# Patient Record
Sex: Male | Born: 1960 | Race: Black or African American | Hispanic: No | Marital: Married | Smoking: Never smoker
Health system: Southern US, Community
[De-identification: ages and names within clinical notes are randomized; demographics above are authoritative.]

## PROBLEM LIST (undated history)

## (undated) DIAGNOSIS — I10 Essential (primary) hypertension: Secondary | ICD-10-CM

## (undated) DIAGNOSIS — M1990 Unspecified osteoarthritis, unspecified site: Secondary | ICD-10-CM

## (undated) DIAGNOSIS — G4733 Obstructive sleep apnea (adult) (pediatric): Secondary | ICD-10-CM

## (undated) HISTORY — PX: OTHER SURGICAL HISTORY: SHX169

---

## 2012-01-04 ENCOUNTER — Encounter (HOSPITAL_COMMUNITY): Payer: Self-pay | Admitting: Emergency Medicine

## 2012-01-04 ENCOUNTER — Emergency Department (HOSPITAL_COMMUNITY): Payer: No Typology Code available for payment source

## 2012-01-04 ENCOUNTER — Emergency Department (HOSPITAL_COMMUNITY)
Admission: EM | Admit: 2012-01-04 | Discharge: 2012-01-04 | Disposition: A | Discharge status: Home / Self Care | Payer: No Typology Code available for payment source | Attending: Emergency Medicine | Admitting: Emergency Medicine

## 2012-01-04 DIAGNOSIS — IMO0002 Reserved for concepts with insufficient information to code with codable children: Secondary | ICD-10-CM

## 2012-01-04 HISTORY — DX: Unspecified osteoarthritis, unspecified site: M19.90

## 2012-01-04 HISTORY — DX: Obstructive sleep apnea (adult) (pediatric): G47.33
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
## 2012-01-05 NOTE — ED Provider Notes (Signed)
Medical screening examination/treatment/procedure(s) were performed by non-physician practitioner and as supervising physician I was immediately available for consultation/collaboration.   Zerick Prevette, MD 01/05/12 1344 

## 2018-11-09 ENCOUNTER — Other Ambulatory Visit: Payer: Self-pay | Admitting: *Deleted

## 2018-11-09 DIAGNOSIS — Z20822 Contact with and (suspected) exposure to covid-19: Secondary | ICD-10-CM

## 2018-11-13 LAB — NOVEL CORONAVIRUS, NAA: SARS-CoV-2, NAA: NOT DETECTED

## 2020-05-17 ENCOUNTER — Other Ambulatory Visit: Payer: Self-pay

## 2020-05-17 ENCOUNTER — Encounter (HOSPITAL_BASED_OUTPATIENT_CLINIC_OR_DEPARTMENT_OTHER): Payer: Self-pay

## 2020-05-17 ENCOUNTER — Emergency Department (HOSPITAL_BASED_OUTPATIENT_CLINIC_OR_DEPARTMENT_OTHER)
Admission: EM | Admit: 2020-05-17 | Discharge: 2020-05-17 | Disposition: A | Discharge status: Home / Self Care | Payer: BLUE CROSS/BLUE SHIELD | Attending: Emergency Medicine | Admitting: Emergency Medicine

## 2020-05-17 ENCOUNTER — Other Ambulatory Visit (HOSPITAL_BASED_OUTPATIENT_CLINIC_OR_DEPARTMENT_OTHER): Payer: Self-pay | Admitting: Family Medicine

## 2020-05-17 DIAGNOSIS — I10 Essential (primary) hypertension: Secondary | ICD-10-CM | POA: Insufficient documentation

## 2020-05-17 DIAGNOSIS — N12 Tubulo-interstitial nephritis, not specified as acute or chronic: Secondary | ICD-10-CM

## 2020-05-17 DIAGNOSIS — N200 Calculus of kidney: Secondary | ICD-10-CM | POA: Diagnosis not present

## 2020-05-17 DIAGNOSIS — N10 Acute tubulo-interstitial nephritis: Secondary | ICD-10-CM | POA: Insufficient documentation

## 2020-05-17 DIAGNOSIS — R319 Hematuria, unspecified: Secondary | ICD-10-CM | POA: Diagnosis present

## 2020-05-17 DIAGNOSIS — R310 Gross hematuria: Secondary | ICD-10-CM

## 2020-05-17 HISTORY — DX: Essential (primary) hypertension: I10

## 2020-05-17 LAB — URINALYSIS, ROUTINE W REFLEX MICROSCOPIC

## 2020-05-17 LAB — URINALYSIS, MICROSCOPIC (REFLEX)
RBC / HPF: >50 RBC/hpf (ref 0–5)
WBC, UA: >50 WBC/hpf (ref 0–5)

## 2020-05-17 MED ORDER — LIDOCAINE HCL (PF) 1 % IJ SOLN
2.0000 mL | Freq: Once | INTRAMUSCULAR | Status: AC
  Administered 2020-05-17: 2 mL
  Filled 2020-05-17: qty 5

## 2020-05-17 MED ORDER — CEFTRIAXONE SODIUM 1 G IJ SOLR
1.0000 g | Freq: Once | INTRAMUSCULAR | Status: AC
  Administered 2020-05-17: 1 g via INTRAMUSCULAR
  Filled 2020-05-17: qty 10

## 2020-05-17 MED ORDER — ONDANSETRON 4 MG PO TBDP
4.0000 mg | ORAL_TABLET | Freq: Once | ORAL | Status: AC
  Administered 2020-05-17: 4 mg via ORAL
  Filled 2020-05-17: qty 1

## 2020-05-17 MED ORDER — HYDROCODONE-ACETAMINOPHEN 5-325 MG PO TABS: 1.0000 | ORAL_TABLET | ORAL | 0 refills | Status: DC | PRN

## 2020-05-17 MED ORDER — CEPHALEXIN 500 MG PO CAPS: 500.0000 mg | ORAL_CAPSULE | Freq: Two times a day (BID) | ORAL | 0 refills | Status: DC

## 2020-05-17 MED ORDER — ONDANSETRON HCL 4 MG PO TABS: 4.0000 mg | ORAL_TABLET | Freq: Three times a day (TID) | ORAL | 0 refills | Status: DC | PRN

## 2020-05-17 MED ORDER — HYDROCODONE-ACETAMINOPHEN 5-325 MG PO TABS
1.0000 | ORAL_TABLET | Freq: Once | ORAL | Status: AC
  Administered 2020-05-17: 1 via ORAL
  Filled 2020-05-17: qty 1

## 2020-05-17 MED FILL — CEPHALEXIN 500 MG CAPSULE: 500 | 6 days supply | Qty: 12 | Fill #0

## 2020-05-17 MED FILL — ONDANSETRON HCL 4 MG TABLET: 4 | 6 days supply | Qty: 20 | Fill #0

## 2020-05-17 MED FILL — HYDROCODON-APAP 5-325: 5-325 | 2 days supply | Qty: 10 | Fill #0

## 2020-05-17 NOTE — Discharge Instructions (Signed)
You were evaluated for hematuria (blood in urine).  Your urine studies were consistent with a kidney infection called pyelonephritis. We will treat you with an injection of antibiotics (Rocephin) and have you continue antibiotics orally for 6 days (Keflex).   We were unable to appreciate any large stones actively passing through your ureters based on results of this imaging study.   We were able to discuss your case with the physicians at the urology office that you were referred to and they have agreed to move your appointment up to Tuesday rather than Thursday.  Your appointment will be with Dr. Patsi Sears at  11:15 AM on 05/21/2020 at  [address], [place], [place]  Please try to drink plenty of fluids to help stay hydrated and passing of any small stones that may be present.   Please return to care if you experience worsening pain despite pain medications, persistent vomiting, fevers or are unable to tolerate oral fluids.

## 2020-05-17 NOTE — ED Triage Notes (Signed)
Pt reports hx of kidney stone in nov that he has been taking flomax regularly. Pt reports blood in urine, painful urination and L flank pain. Pt states he left [facility] due to long wait, but prior to arrival had a CT and blood work drawn, and a UA. Pt states when he was seen and dx for a kidney stone he had pain on the R side.

## 2020-05-17 NOTE — ED Provider Notes (Signed)
[facility] EMERGENCY DEPARTMENT Provider Note   CSN: [id number] Arrival date & time: 05/17/20  4081     History Chief Complaint  Patient presents with  . Hematuria    Stephen Cox is a 60 y.o. male.    Patient reports he has been having hematuria for >24 hours along with left sided flank pain and suprapubic abdominal pain.  Patient also reports experiencing urinary urgency and frequency as well as the sensation that he is unable to completely empty his bladder.  Patient reports that flank pain is similar to when he was diagnosed with a right renal stone in September 2021.  Patient reports having urology follow-up scheduled for 05/23/2020.  He states that he was in the ED in [facility] overnight where he had imaging and urine studies completed.  Results of these studies listed in detail as below.  Patient denies any fevers or chills.  He does report 1 episode of emesis on the way to [facility] this morning.  Patient is unaware of any family history of renal cell carcinoma or frequent nephrolithiasis as he is adopted.  Patient does report history of having scrotal cyst removed.  Patient denies any prior history of hematuria with his prior renal stone.  He reports passing small blood substances along with hematuria.  He denies any difficulty breathing or shortness of breath or chest pain.    Chart Review  Patient was evaluated in [facility] ED on 05/16/20 where stone study was completed and showed left nephrolithiasis that was nonobstructive. Also noted right inguinal hernia and chronic hydroureteronephrosis.         Past Medical History:  Diagnosis Date  . DJD (degenerative joint disease)   . Hypertension   . Mild obstructive sleep apnea     There are no problems to display for this patient.   Past Surgical History:  Procedure Laterality Date  . scrotal cyst removal         History reviewed. No pertinent family history.  Social History   Tobacco Use   . Smoking status: Never Smoker  Substance Use Topics  . Alcohol use: No  . Drug use: No    Home Medications Prior to Admission medications   Medication Sig Start Date End Date Taking? Authorizing Provider  cephALEXin (KEFLEX) 500 MG capsule Take 1 capsule (500 mg total) by mouth every 12 (twelve) hours for 6 days. 05/18/20 05/24/20 Yes Simmons-Robinson, Bellah Alia, MD  HYDROcodone-acetaminophen (NORCO/VICODIN) 5-325 MG tablet Take 1 tablet by mouth every 4 (four) hours as needed for up to 3 days for moderate pain or severe pain. 05/17/20 05/20/20 Yes Simmons-Robinson, Francetta Ilg, MD  ondansetron (ZOFRAN) 4 MG tablet Take 1 tablet (4 mg total) by mouth every 8 (eight) hours as needed for nausea or vomiting. 05/17/20 05/17/21 Yes Simmons-Robinson, Inda Mcglothen, MD  amitriptyline (ELAVIL) 10 MG tablet Take 20 mg by mouth at bedtime as needed. migraine    [provider]  eletriptan (RELPAX) 20 MG tablet One tablet by mouth at onset of headache. May repeat in 2 hours if headache persists or recurs. Nausea. May repeat in 2 hours if necessary    [provider]  Multiple Vitamin (MULTIVITAMIN WITH MINERALS) TABS Take 1 tablet by mouth daily.    [provider]  promethazine (PHENERGAN) 25 MG tablet Take 25 mg by mouth every 6 (six) hours as needed. Nausea    [provider]    Allergies    Patient has no known allergies.  Review of Systems   Review of Systems  Constitutional: Positive for appetite change. Negative for chills and fever.  Gastrointestinal: Positive for abdominal pain, nausea and vomiting.  Genitourinary: Positive for flank pain, frequency, hematuria and urgency.  Musculoskeletal:       Flank pain    Neurological: Positive for headaches.    Physical Exam Updated Vital Signs BP (!) 161/106 (BP Location: Right Arm)   Pulse 93   Temp 98 F (36.7 C) (Oral)   Resp 18   Ht 5\' 8"  (1.727 m)   Wt 93.4 kg   SpO2 99%   BMI 31.32 kg/m   Physical  Exam Vitals reviewed.  Constitutional:      Appearance: Normal appearance.     Comments: Patient lying still in bed, uncomfortable appearing   Cardiovascular:     Rate and Rhythm: Normal rate and regular rhythm.     Pulses: Normal pulses.     Heart sounds: Normal heart sounds.  Pulmonary:     Effort: Pulmonary effort is normal. No respiratory distress.     Breath sounds: Normal breath sounds. No wheezing.  Abdominal:     General: Bowel sounds are decreased.     Palpations: Abdomen is soft.     Tenderness: There is abdominal tenderness in the suprapubic area, left upper quadrant and left lower quadrant. There is left CVA tenderness. There is no right CVA tenderness.  Skin:    General: Skin is warm and dry.     Capillary Refill: Capillary refill takes less than 2 seconds.     Findings: No erythema or rash.  Neurological:     Mental Status: He is alert and oriented to person, place, and time.     ED Results / Procedures / Treatments   Labs (all labs ordered are listed, but only abnormal results are displayed) Labs Reviewed  URINALYSIS, ROUTINE W REFLEX MICROSCOPIC - Abnormal; Notable for the following components:      Result Value   Color, Urine RED (*)    APPearance TURBID (*)    Glucose, UA   (*)    Value: TEST NOT REPORTED DUE TO COLOR INTERFERENCE OF URINE PIGMENT   Hgb urine dipstick   (*)    Value: TEST NOT REPORTED DUE TO COLOR INTERFERENCE OF URINE PIGMENT   Bilirubin Urine   (*)    Value: TEST NOT REPORTED DUE TO COLOR INTERFERENCE OF URINE PIGMENT   Ketones, ur   (*)    Value: TEST NOT REPORTED DUE TO COLOR INTERFERENCE OF URINE PIGMENT   Protein, ur   (*)    Value: TEST NOT REPORTED DUE TO COLOR INTERFERENCE OF URINE PIGMENT   Nitrite   (*)    Value: TEST NOT REPORTED DUE TO COLOR INTERFERENCE OF URINE PIGMENT   Leukocytes,Ua   (*)    Value: TEST NOT REPORTED DUE TO COLOR INTERFERENCE OF URINE PIGMENT   All other components within normal limits  URINALYSIS,  MICROSCOPIC (REFLEX) - Abnormal; Notable for the following components:   Bacteria, UA MANY (*)    All other components within normal limits  URINE CULTURE    EKG None  Radiology No results found.  Procedures Procedures (including critical care time)  Medications Ordered in ED Medications  lidocaine (PF) (XYLOCAINE) 1 % injection 2 mL (has no administration in time range)  HYDROcodone-acetaminophen (NORCO/VICODIN) 5-325 MG per tablet 1 tablet (1 tablet Oral Given 05/17/20 0925)  ondansetron (ZOFRAN-ODT) disintegrating tablet 4 mg (4 mg Oral Given  05/17/20 0925)  cefTRIAXone (ROCEPHIN) injection 1 g (1 g Intramuscular Given 05/17/20 1018)    ED Course  I have reviewed the triage vital signs and the nursing notes.  Pertinent labs & imaging results that were available during my care of the patient were reviewed by me and considered in my medical decision making (see chart for details).    MDM Rules/Calculators/A&P                          Stephen Cox is a 60 y.o. male presenting with hematuria, urinary urgency and frequency for over 24 hours.  Patient had prior CT stone study that showed punctate nephrolithiasis and hydronephrosis.  Patient's urine analysis from prior visit was consistent with hematuria, proteinuria and leukoesterase without presence of nitrites.  Patient was treated for pain management in the ED and able to tolerate fluids.  He reported improved pain and was arranged for urology follow-up on 05/21/2020 at 11:15 AM. Patient's U/A was difficult to result due to pigmentation, however did not many bacteria on microscopy, in setting of symptoms and CT imaging, this was consistent with acute pyelonephritis. Patient was treated with IM Ceftriaxone followed by 6 days of PO Keflex. Return precautions were discussed and patient were determined to be appropriate for discharge with outpatient urology follow up. At time of discharge, he remained afebrile.   Final Clinical Impression(s)  / ED Diagnoses Final diagnoses:  Gross hematuria  Nephrolithiasis  Pyelonephritis    Rx / DC Orders ED Discharge Orders         Ordered    cephALEXin (KEFLEX) 500 MG capsule  Every 12 hours        05/17/20 1013    HYDROcodone-acetaminophen (NORCO/VICODIN) 5-325 MG tablet  Every 4 hours PRN        05/17/20 0948    ondansetron (ZOFRAN) 4 MG tablet  Every 8 hours PRN        05/17/20 0948           Ronnald Ramp, MD 05/17/20 1026    Blane Ohara, MD 05/17/20 [phone]

## 2020-05-18 LAB — URINE CULTURE

## 2021-01-17 ENCOUNTER — Other Ambulatory Visit: Payer: Self-pay

## 2021-01-17 ENCOUNTER — Encounter (HOSPITAL_BASED_OUTPATIENT_CLINIC_OR_DEPARTMENT_OTHER): Payer: Self-pay | Admitting: Emergency Medicine

## 2021-01-17 ENCOUNTER — Emergency Department (HOSPITAL_BASED_OUTPATIENT_CLINIC_OR_DEPARTMENT_OTHER): Payer: BLUE CROSS/BLUE SHIELD

## 2021-01-17 ENCOUNTER — Emergency Department (HOSPITAL_BASED_OUTPATIENT_CLINIC_OR_DEPARTMENT_OTHER)
Admission: EM | Admit: 2021-01-17 | Discharge: 2021-01-17 | Disposition: A | Discharge status: Home / Self Care | Payer: BLUE CROSS/BLUE SHIELD | Attending: Emergency Medicine | Admitting: Emergency Medicine

## 2021-01-17 DIAGNOSIS — R519 Headache, unspecified: Secondary | ICD-10-CM | POA: Insufficient documentation

## 2021-01-17 DIAGNOSIS — R791 Abnormal coagulation profile: Secondary | ICD-10-CM | POA: Diagnosis not present

## 2021-01-17 DIAGNOSIS — Z20822 Contact with and (suspected) exposure to covid-19: Secondary | ICD-10-CM | POA: Diagnosis not present

## 2021-01-17 DIAGNOSIS — Z79899 Other long term (current) drug therapy: Secondary | ICD-10-CM | POA: Diagnosis not present

## 2021-01-17 DIAGNOSIS — I10 Essential (primary) hypertension: Secondary | ICD-10-CM | POA: Insufficient documentation

## 2021-01-17 DIAGNOSIS — N3000 Acute cystitis without hematuria: Secondary | ICD-10-CM | POA: Insufficient documentation

## 2021-01-17 DIAGNOSIS — R531 Weakness: Secondary | ICD-10-CM | POA: Insufficient documentation

## 2021-01-17 DIAGNOSIS — R42 Dizziness and giddiness: Secondary | ICD-10-CM | POA: Insufficient documentation

## 2021-01-17 LAB — COMPREHENSIVE METABOLIC PANEL
ALT: 20 U/L (ref 0–44)
AST: 21 U/L (ref 15–41)
Albumin: 3.3 g/dL — ABNORMAL LOW (ref 3.5–5.0)
Alkaline Phosphatase: 47 U/L (ref 38–126)
Anion gap: 8 (ref 5–15)
BUN: 14 mg/dL (ref 6–20)
CO2: 25 mmol/L (ref 22–32)
Calcium: 8.3 mg/dL — ABNORMAL LOW (ref 8.9–10.3)
Chloride: 101 mmol/L (ref 98–111)
Creatinine, Ser: 1.15 mg/dL (ref 0.61–1.24)
GFR, Estimated: >60 mL/min (ref >60)
Glucose, Bld: 104 mg/dL — ABNORMAL HIGH (ref 70–99)
Potassium: 3.2 mmol/L — ABNORMAL LOW (ref 3.5–5.1)
Sodium: 134 mmol/L — ABNORMAL LOW (ref 135–145)
Total Bilirubin: 0.6 mg/dL (ref 0.3–1.2)
Total Protein: 7.8 g/dL (ref 6.5–8.1)

## 2021-01-17 LAB — PROTIME-INR
INR: 1.3 — ABNORMAL HIGH (ref 0.8–1.2)
Prothrombin Time: 16.2 seconds — ABNORMAL HIGH (ref 11.4–15.2)

## 2021-01-17 LAB — CBC WITH DIFFERENTIAL/PLATELET
Abs Immature Granulocytes: 0.08 10*3/uL — ABNORMAL HIGH (ref 0.00–0.07)
Basophils Absolute: 0 10*3/uL (ref 0.0–0.1)
Basophils Relative: 0 %
Eosinophils Absolute: 0.1 10*3/uL (ref 0.0–0.5)
Eosinophils Relative: 2 %
HCT: 34.9 % — ABNORMAL LOW (ref 39.0–52.0)
Hemoglobin: 12 g/dL — ABNORMAL LOW (ref 13.0–17.0)
Immature Granulocytes: 1 %
Lymphocytes Relative: 17 %
Lymphs Abs: 1 10*3/uL (ref 0.7–4.0)
MCH: 29.9 pg (ref 26.0–34.0)
MCHC: 34.4 g/dL (ref 30.0–36.0)
MCV: 86.8 fL (ref 80.0–100.0)
Monocytes Absolute: 0.7 10*3/uL (ref 0.1–1.0)
Monocytes Relative: 12 %
Neutro Abs: 4.1 10*3/uL (ref 1.7–7.7)
Neutrophils Relative %: 68 %
Platelets: 187 10*3/uL (ref 150–400)
RBC: 4.02 MIL/uL — ABNORMAL LOW (ref 4.22–5.81)
RDW: 13 % (ref 11.5–15.5)
WBC: 6 10*3/uL (ref 4.0–10.5)
nRBC: 0 % (ref 0.0–0.2)

## 2021-01-17 LAB — URINALYSIS, MICROSCOPIC (REFLEX): WBC, UA: >50 WBC/hpf (ref 0–5)

## 2021-01-17 LAB — URINALYSIS, ROUTINE W REFLEX MICROSCOPIC
Glucose, UA: NEGATIVE mg/dL
Ketones, ur: NEGATIVE mg/dL
Nitrite: POSITIVE — AB
Protein, ur: 100 mg/dL — AB
Specific Gravity, Urine: 1.03 (ref 1.005–1.030)
pH: 6 (ref 5.0–8.0)

## 2021-01-17 LAB — MAGNESIUM: Magnesium: 1.8 mg/dL (ref 1.7–2.4)

## 2021-01-17 LAB — PHOSPHORUS: Phosphorus: 2.3 mg/dL — ABNORMAL LOW (ref 2.5–4.6)

## 2021-01-17 LAB — TROPONIN I (HIGH SENSITIVITY)
Troponin I (High Sensitivity): 5 ng/L (ref <18)
Troponin I (High Sensitivity): 6 ng/L (ref <18)

## 2021-01-17 LAB — RAPID URINE DRUG SCREEN, HOSP PERFORMED
Amphetamines: NOT DETECTED
Barbiturates: NOT DETECTED
Benzodiazepines: NOT DETECTED
Cocaine: NOT DETECTED
Opiates: NOT DETECTED
Tetrahydrocannabinol: NOT DETECTED

## 2021-01-17 LAB — RESP PANEL BY RT-PCR (FLU A&B, COVID) ARPGX2
Influenza A by PCR: NEGATIVE
Influenza B by PCR: NEGATIVE
SARS Coronavirus 2 by RT PCR: NEGATIVE

## 2021-01-17 LAB — TSH: TSH: 1.877 u[IU]/mL (ref 0.350–4.500)

## 2021-01-17 LAB — BRAIN NATRIURETIC PEPTIDE: B Natriuretic Peptide: 19 pg/mL (ref 0.0–100.0)

## 2021-01-17 MED ORDER — LACTATED RINGERS IV BOLUS
1000.0000 mL | Freq: Once | INTRAVENOUS | Status: AC
  Administered 2021-01-17: 1000 mL via INTRAVENOUS

## 2021-01-17 MED ORDER — SODIUM CHLORIDE 0.9 % IV SOLN
1.0000 g | Freq: Once | INTRAVENOUS | Status: AC
  Administered 2021-01-17: 1 g via INTRAVENOUS
  Filled 2021-01-17: qty 10

## 2021-01-17 MED ORDER — CEPHALEXIN 500 MG PO CAPS: 1000.0000 mg | ORAL_CAPSULE | Freq: Two times a day (BID) | ORAL | 0 refills | Status: DC

## 2021-01-17 MED ORDER — ACETAMINOPHEN 500 MG PO TABS
1000.0000 mg | ORAL_TABLET | Freq: Once | ORAL | Status: AC
  Administered 2021-01-17: 1000 mg via ORAL
  Filled 2021-01-17: qty 2

## 2021-01-17 NOTE — ED Triage Notes (Addendum)
Weak , dizzy and tingling in both hands  x 1 week saw [facility] yesterday and encouraged to come to ER states H/A also for 1 week

## 2021-01-17 NOTE — Discharge Instructions (Addendum)
1.  You have tested positive for urinary tract infection today.  You need antibiotics.  Take Keflex twice daily as prescribed.  Take extra strength Tylenol every 6 hours for aches and pains. 2.  Stay hydrated and rest. 3.  Schedule recheck with your doctor within the next 2 to 4 days. 4.  Return to the emergency department if you get any worsening symptoms.  Return for fever, pain, worsening weakness or confusion.

## 2021-01-17 NOTE — ED Provider Notes (Signed)
[facility] EMERGENCY DEPARTMENT Provider Note   CSN: [id number] Arrival date & time: 01/17/21  4970     History Chief Complaint  Patient presents with   Dizziness    Stephen Cox is a 60 y.o. male.  HPI Patient reports he is having some significant generalized weakness for about 5 to 6 days.  Patient reports he does work very hard with long hours.  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  He reports on Sunday he is busy with church events, [employer] and doing support activities.  Patient reports on Saturday he was trying to get some pretty extreme fatigue and general weakness.  He did not go to work for his night shift on Saturday.  He tried to go back for a couple more days and found that he was so fatigued that with standing he felt kind of dizzy and lightheaded.  He was not able to perform so well at his job because he could not do extra duties.  Patient denies he is having any specific pain.  He does note over the past couple days now he has had headache that somewhat at the back of his head and his temples.  Has been waxing and waning.  No cough, no chest pain, no shortness of breath.  No vomiting or diarrhea.  No abdominal pain no urinary changes.  Patient reports he has a history of hypertension and took antihypertensive medications for a while but then blood pressures improved and he has not been on any for a number of months.  He does note that sometimes his blood pressures are more elevated as he is monitoring them.  He might get systolic numbers up in the 150s maybe 170s and diastolic sometimes up to the 100s.  Patient does not smoke, drink alcohol or any drug use.    Past Medical History:  Diagnosis Date   DJD (degenerative joint disease)    Hypertension    Mild obstructive sleep apnea     There are no problems to display for this  patient.   Past Surgical History:  Procedure Laterality Date   scrotal cyst removal         No family history on file.  Social History   Tobacco Use   Smoking status: Never    Passive exposure: Never  Vaping Use   Vaping Use: Never used  Substance Use Topics   Alcohol use: No   Drug use: No    Home Medications Prior to Admission medications   Medication Sig Start Date End Date Taking? Authorizing Provider  cephALEXin (KEFLEX) 500 MG capsule Take 2 capsules (1,000 mg total) by mouth 2 (two) times daily. 01/17/21  Yes Donivan Thammavong, Lebron Conners, MD  amitriptyline (ELAVIL) 10 MG tablet Take 20 mg by mouth at bedtime as needed. migraine    [provider]  cephALEXin (KEFLEX) 500 MG capsule TAKE 1 CAPSULE BY MOUTH EVERY 12 HOURS FOR 6 DAYS 05/17/20 05/17/21  Simmons-Robinson, Tawanna Cooler, MD  eletriptan (RELPAX) 20 MG tablet One tablet by mouth at onset of headache. May repeat in 2 hours if headache persists or recurs. Nausea. May repeat in 2 hours if necessary    [provider]  Multiple Vitamin (MULTIVITAMIN WITH MINERALS) TABS Take 1 tablet by mouth daily.    [provider]  ondansetron [facility])  4 MG tablet TAKE 1 TABLET BY MOUTH EVERY 8 HOURS AS NEEDED FOR NAUSEA AND/OR VOMITING 05/17/20 05/17/21  Simmons-Robinson, Tawanna Cooler, MD  promethazine (PHENERGAN) 25 MG tablet Take 25 mg by mouth every 6 (six) hours as needed. Nausea    [provider]    Allergies    Patient has no known allergies.  Review of Systems   Review of Systems 10 systems reviewed negative except as per HPI Physical Exam Updated Vital Signs BP (!) 148/95   Pulse 91   Temp 98.9 F (37.2 C) (Oral)   Resp (!) 9   Ht 5\' 8"  (1.727 m)   Wt 93.4 kg   SpO2 99%   BMI 31.31 kg/m   Physical Exam Constitutional:      Appearance: Normal appearance.  HENT:     Head: Normocephalic and atraumatic.     Nose: Nose normal.     Mouth/Throat:     Mouth: Mucous membranes are moist.      Pharynx: Oropharynx is clear.     Comments: Dentition in good condition Eyes:     Extraocular Movements: Extraocular movements intact.     Conjunctiva/sclera: Conjunctivae normal.     Pupils: Pupils are equal, round, and reactive to light.  Cardiovascular:     Rate and Rhythm: Normal rate and regular rhythm.  Pulmonary:     Effort: Pulmonary effort is normal.     Breath sounds: Normal breath sounds.  Abdominal:     General: There is no distension.     Palpations: Abdomen is soft.     Tenderness: There is no abdominal tenderness. There is no guarding.  Musculoskeletal:        General: No swelling or tenderness. Normal range of motion.     Cervical back: Neck supple.     Right lower leg: No edema.     Left lower leg: No edema.  Skin:    General: Skin is warm and dry.  Neurological:     General: No focal deficit present.     Mental Status: He is alert and oriented to person, place, and time.     Sensory: No sensory deficit.     Motor: No weakness.     Coordination: Coordination normal.  Psychiatric:        Mood and Affect: Mood normal.    ED Results / Procedures / Treatments   Labs (all labs ordered are listed, but only abnormal results are displayed) Labs Reviewed  COMPREHENSIVE METABOLIC PANEL - Abnormal; Notable for the following components:      Result Value   Sodium 134 (*)    Potassium 3.2 (*)    Glucose, Bld 104 (*)    Calcium 8.3 (*)    Albumin 3.3 (*)    All other components within normal limits  CBC WITH DIFFERENTIAL/PLATELET - Abnormal; Notable for the following components:   RBC 4.02 (*)    Hemoglobin 12.0 (*)    HCT 34.9 (*)    Abs Immature Granulocytes 0.08 (*)    All other components within normal limits  PROTIME-INR - Abnormal; Notable for the following components:   Prothrombin Time 16.2 (*)    INR 1.3 (*)    All other components within normal limits  URINALYSIS, ROUTINE W REFLEX MICROSCOPIC - Abnormal; Notable for the following components:    Color, Urine AMBER (*)    APPearance CLOUDY (*)    Hgb urine dipstick LARGE (*)    Bilirubin Urine SMALL (*)  Protein, ur 100 (*)    Nitrite POSITIVE (*)    Leukocytes,Ua SMALL (*)    All other components within normal limits  PHOSPHORUS - Abnormal; Notable for the following components:   Phosphorus 2.3 (*)    All other components within normal limits  URINALYSIS, MICROSCOPIC (REFLEX) - Abnormal; Notable for the following components:   Bacteria, UA MANY (*)    All other components within normal limits  RESP PANEL BY RT-PCR (FLU A&B, COVID) ARPGX2  URINE CULTURE  BRAIN NATRIURETIC PEPTIDE  RAPID URINE DRUG SCREEN, HOSP PERFORMED  MAGNESIUM  TSH  RPR  HIV ANTIBODY (ROUTINE TESTING W REFLEX)  GC/CHLAMYDIA PROBE AMP ([place]) NOT AT [facility]  TROPONIN I (HIGH SENSITIVITY)  TROPONIN I (HIGH SENSITIVITY)    EKG EKG Interpretation  Date/Time:  Friday January 17 2021 09:33:37 EDT Ventricular Rate:  103 PR Interval:  155 QRS Duration: 98 QT Interval:  324 QTC Calculation: 425 R Axis:   79 Text Interpretation: Sinus tachycardia otherwise normal, no ischemic appearance Confirmed by Arby Barrette [phone]) on 01/17/2021 3:11:41 PM  Radiology CT Head Wo Contrast  Result Date: 01/17/2021 CLINICAL DATA:  Dizziness, headache, numbness in hands for 1 week EXAM: CT HEAD WITHOUT CONTRAST TECHNIQUE: Contiguous axial images were obtained from the base of the skull through the vertex without intravenous contrast. COMPARISON:  None. FINDINGS: Brain: No evidence of acute infarction, hemorrhage, hydrocephalus, extra-axial collection or mass lesion/mass effect. Mild periventricular and deep white matter hypodensity. Vascular: No hyperdense vessel or unexpected calcification. Skull: Normal. Negative for fracture or focal lesion. Sinuses/Orbits: No acute finding.  Left phthisis bulbi. Other: None. IMPRESSION: 1. No acute intracranial pathology. Small-vessel white matter disease. 2. Left phthisis  bulbi. Electronically Signed   By: Lauralyn Primes M.D.   On: 01/17/2021 11:33   DG Chest Port 1 View  Result Date: 01/17/2021 CLINICAL DATA:  Weakness EXAM: PORTABLE CHEST 1 VIEW COMPARISON:  Chest x-ray dated January 16, 2020 FINDINGS: Cardiac and mediastinal contours are within normal limits when accounting for lordotic angulation. Lungs are clear. No large pleural effusion or pneumothorax. IMPRESSION: No active disease. Electronically Signed   By: Allegra Lai M.D.   On: 01/17/2021 11:36    Procedures Procedures   Medications Ordered in ED Medications  lactated ringers bolus 1,000 mL (1,000 mLs Intravenous New Bag/Given 01/17/21 1243)  cefTRIAXone (ROCEPHIN) 1 g in sodium chloride 0.9 % 100 mL IVPB (1 g Intravenous New Bag/Given 01/17/21 1246)  acetaminophen (TYLENOL) tablet 1,000 mg (1,000 mg Oral Given 01/17/21 1244)    ED Course  I have reviewed the triage vital signs and the nursing notes.  Pertinent labs & imaging results that were available during my care of the patient were reviewed by me and considered in my medical decision making (see chart for details).    MDM Rules/Calculators/A&P                           Patient presents predominantly with malaise and general weakness.  Symptoms have been progressive over several days.  He is also had some generalized headache.  At this time urinalysis is grossly positive for UTI.  Patient does describe constitutional symptoms but does not have positive SIRS symptoms.  At this time will opt to treat with a first dose of IV antibiotics and hydration in the emergency department.  Patient is nontoxic with clear mental status and no respiratory distress.  Careful return precautions reviewed. Final Clinical Impression(s) /  ED Diagnoses Final diagnoses:  Acute cystitis without hematuria  General weakness    Rx / DC Orders ED Discharge Orders          Ordered    cephALEXin (KEFLEX) 500 MG capsule  2 times daily        01/17/21 1508              Arby Barrette, MD 01/17/21 1520

## 2021-01-17 NOTE — ED Notes (Signed)
First contact with patient. Patient arrived via triage from home with complaints of dizziness x 2 days with global headache. No neuro deficits noted. Pt is A&OX 4. Respirations even/unlabored. Patient changed into gown and placed on cardiac monitor and call light within reach. Patient updated on plan of care. Will continue to monitor patient.

## 2021-01-17 NOTE — ED Notes (Signed)
No acute distress noted upon this RN's departure of patient. Vital signs stable. Patient taken to checkout window. Discharge paperwork discussed with patient. No further questions voiced upon discharge.   

## 2021-01-17 NOTE — ED Notes (Signed)
Patient transported to CT 

## 2021-01-18 LAB — RPR
RPR Ser Ql: REACTIVE — AB
RPR Titer: 1:1 {titer}

## 2021-01-18 LAB — HIV ANTIBODY (ROUTINE TESTING W REFLEX): HIV Screen 4th Generation wRfx: REACTIVE — AB

## 2021-01-19 LAB — URINE CULTURE: Culture: <10000 — AB

## 2021-01-20 LAB — GC/CHLAMYDIA PROBE AMP (~~LOC~~) NOT AT ARMC
Chlamydia: NEGATIVE
Comment: NEGATIVE
Comment: NORMAL
Neisseria Gonorrhea: POSITIVE — AB

## 2021-01-21 ENCOUNTER — Telehealth: Payer: Self-pay

## 2021-01-21 LAB — HIV-1/2 AB - DIFFERENTIATION
HIV 1 Ab: REACTIVE
HIV 2 Ab: NONREACTIVE

## 2021-01-21 LAB — T.PALLIDUM AB, TOTAL: T Pallidum Abs: REACTIVE — AB

## 2021-01-21 NOTE — Telephone Encounter (Signed)
Referral faxed to [facility].   Marik Sedore R Donnel Venuto, RN  

## 2022-12-24 IMAGING — CT CT HEAD W/O CM
3 series · 15 of 47 positions shown, 18 images · non-contrast
Comparison: None.

CLINICAL DATA: Dizziness, headache, numbness in hands for 1 week

EXAM:
CT HEAD WITHOUT CONTRAST
TECHNIQUE: Contiguous axial images were obtained from the base of the skull
through the vertex without intravenous contrast.

[Series 2: head wo · axial · 0.42mm/px · z∈[-145,-20]mm · 9 of 31 slices shown, 12 images]
[im 3/31  brain]
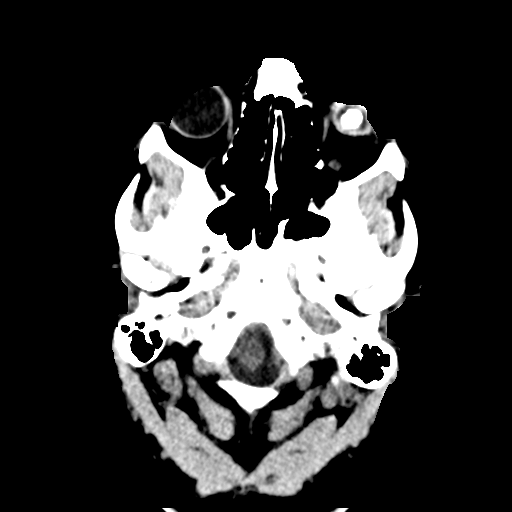
[im 3/31  bone]
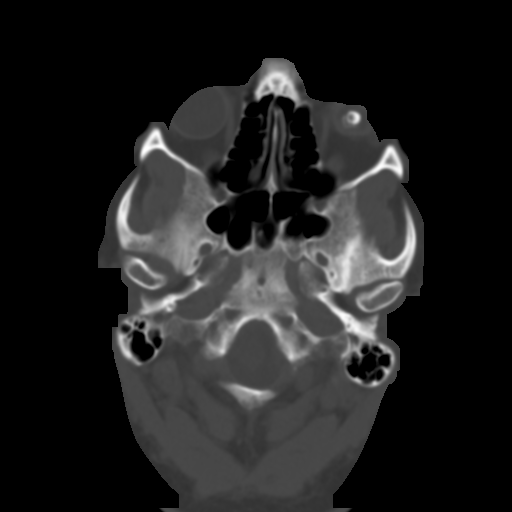
[im 6/31  brain]
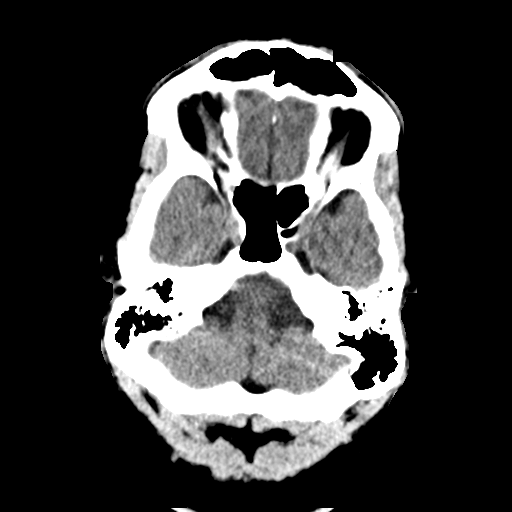
[im 9/31  brain]
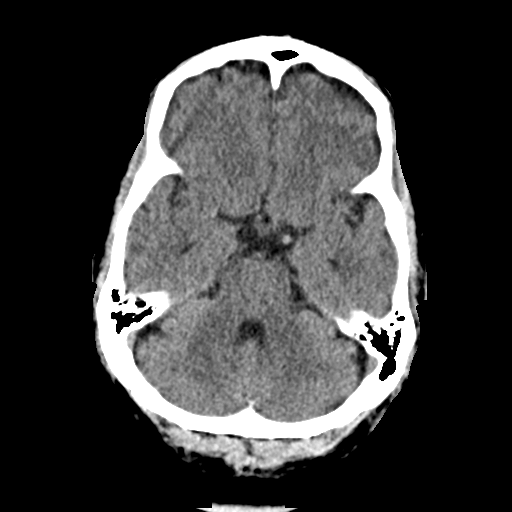
[im 12/31  brain]
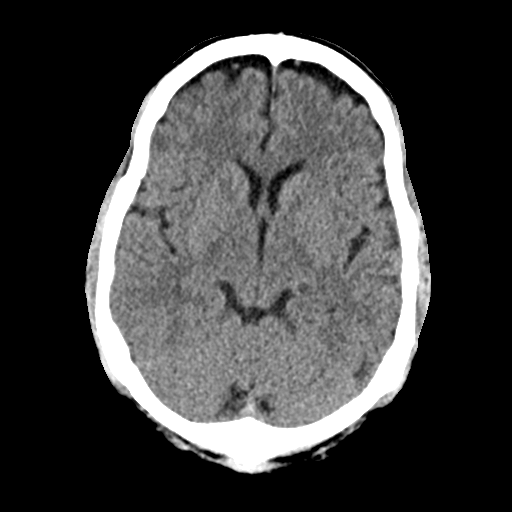
[im 16/31  brain]
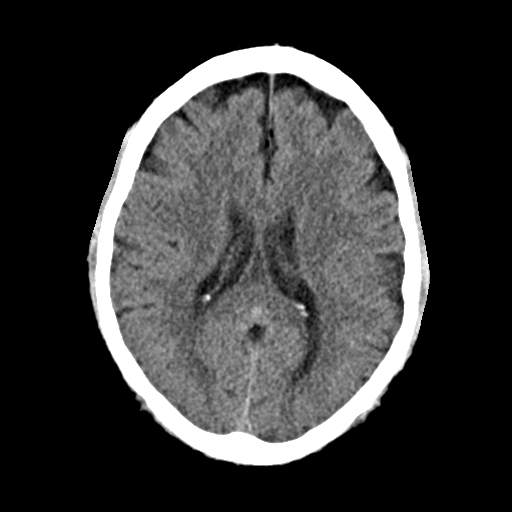
[im 16/31  bone]
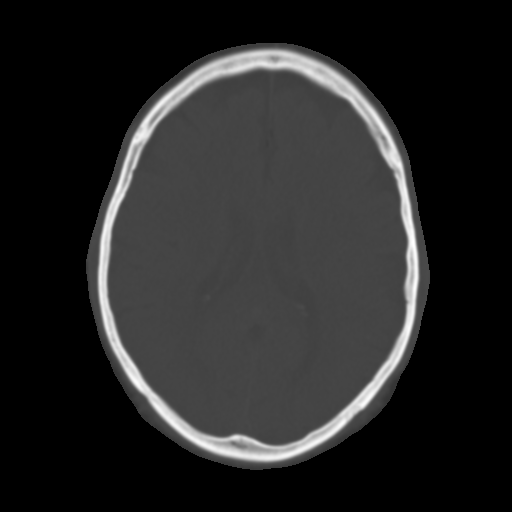
[im 19/31  brain]
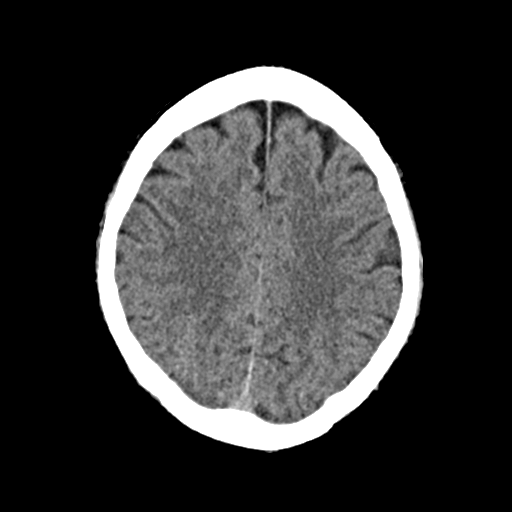
[im 22/31  brain]
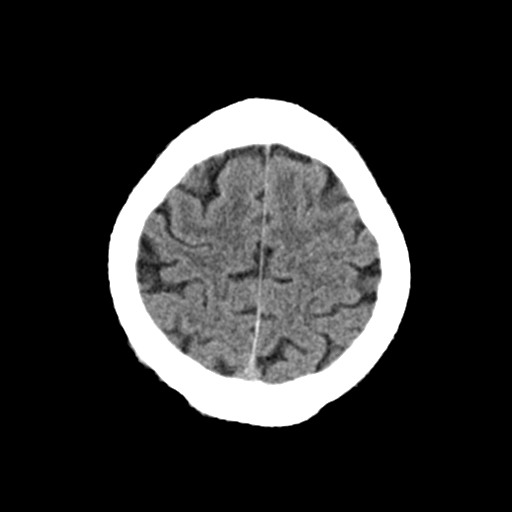
[im 25/31  brain]
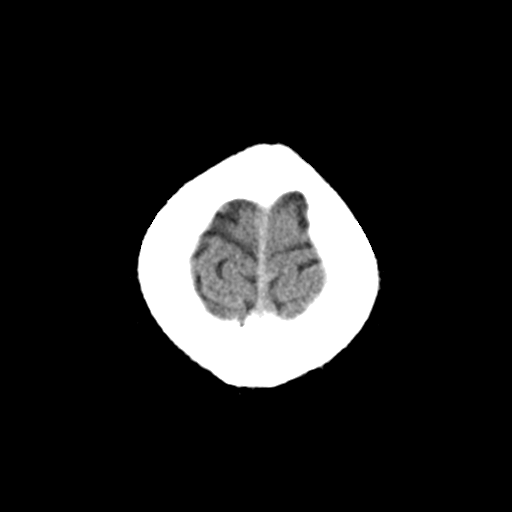
[im 28/31  brain]
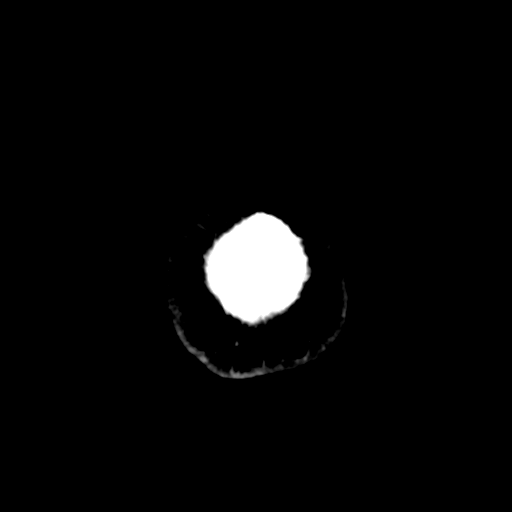
[im 28/31  bone]
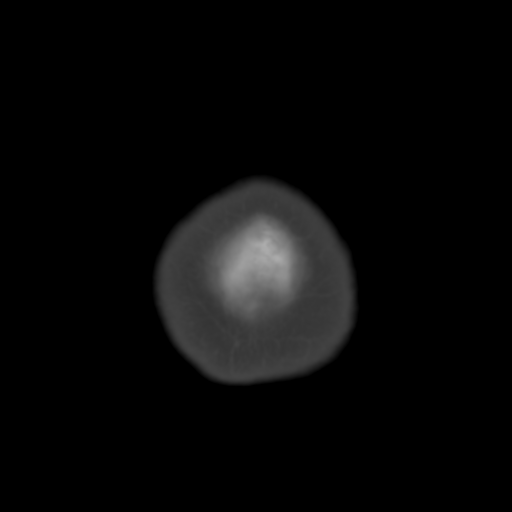

[Series 4: coronal soft · coronal · 0.31mm/px · 3 of 68 slices shown]
[im 23/68  brain]
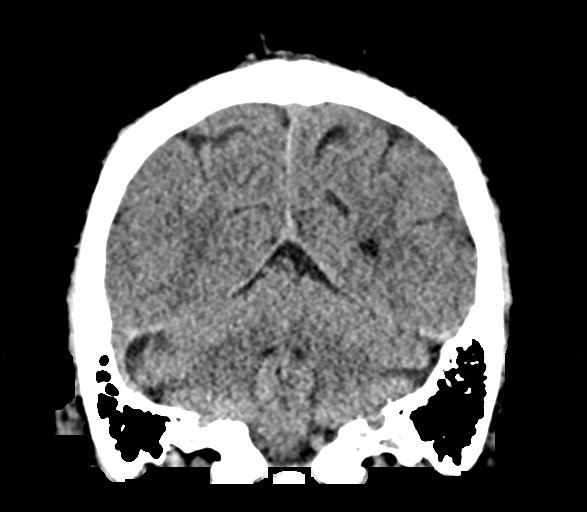
[im 30/68  brain]
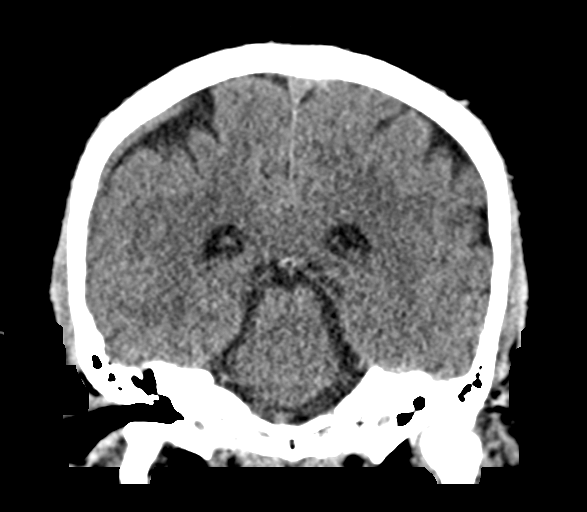
[im 38/68  brain]
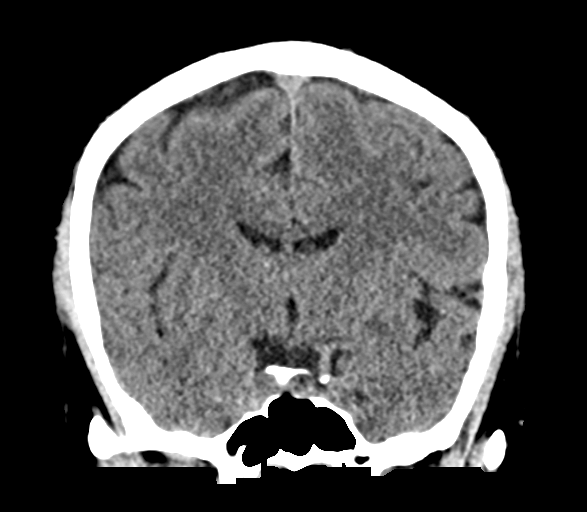

[Series 5: sag soft · sagittal · 0.30mm/px · 3 of 55 slices shown]
[im 19/55  brain]
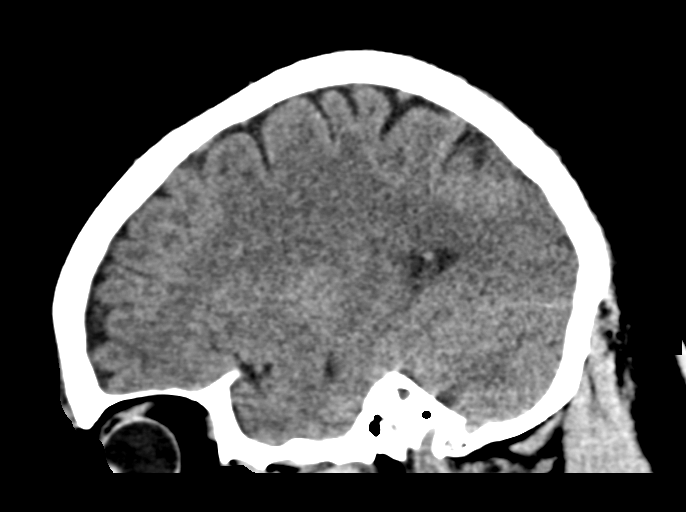
[im 28/55  brain]
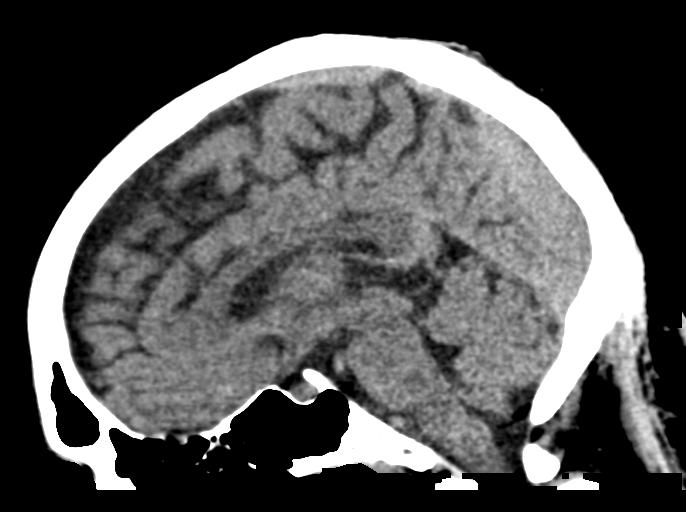
[im 37/55  brain]
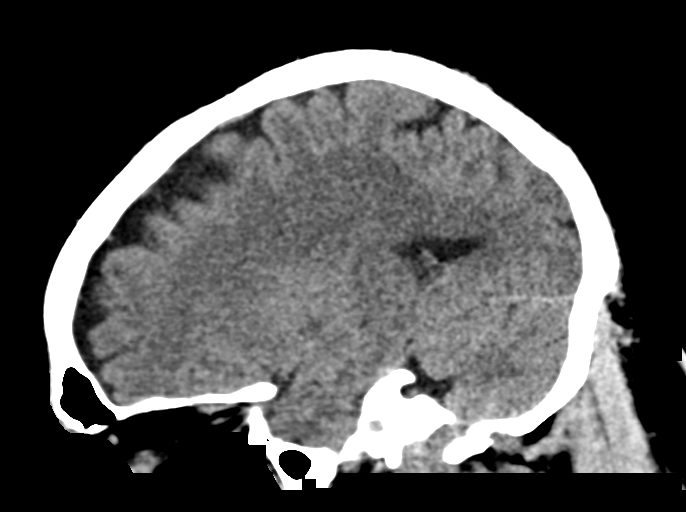

[15 of 47 positions shown; findings below may reference images not displayed]

FINDINGS: Brain: No evidence of acute infarction, hemorrhage, hydrocephalus,
extra-axial collection or mass lesion/mass effect. Mild
periventricular and deep white matter hypodensity.

Vascular: No hyperdense vessel or unexpected calcification.

Skull: Normal. Negative for fracture or focal lesion.

Sinuses/Orbits: No acute finding.  Left phthisis bulbi.

Other: None.
IMPRESSION: 1. No acute intracranial pathology. Small-vessel white matter
disease.
2. Left phthisis bulbi.

## 2023-02-26 ENCOUNTER — Emergency Department (HOSPITAL_BASED_OUTPATIENT_CLINIC_OR_DEPARTMENT_OTHER)
Admission: EM | Admit: 2023-02-26 | Discharge: 2023-02-26 | Disposition: A | Discharge status: Home / Self Care | Payer: BLUE CROSS/BLUE SHIELD | Attending: Emergency Medicine | Admitting: Emergency Medicine

## 2023-02-26 ENCOUNTER — Other Ambulatory Visit: Payer: Self-pay

## 2023-02-26 ENCOUNTER — Emergency Department (HOSPITAL_BASED_OUTPATIENT_CLINIC_OR_DEPARTMENT_OTHER): Payer: BLUE CROSS/BLUE SHIELD

## 2023-02-26 ENCOUNTER — Encounter (HOSPITAL_BASED_OUTPATIENT_CLINIC_OR_DEPARTMENT_OTHER): Payer: Self-pay | Admitting: Urology

## 2023-02-26 DIAGNOSIS — Z21 Asymptomatic human immunodeficiency virus [HIV] infection status: Secondary | ICD-10-CM | POA: Diagnosis not present

## 2023-02-26 DIAGNOSIS — I10 Essential (primary) hypertension: Secondary | ICD-10-CM | POA: Diagnosis not present

## 2023-02-26 DIAGNOSIS — M7989 Other specified soft tissue disorders: Secondary | ICD-10-CM | POA: Diagnosis present

## 2023-02-26 DIAGNOSIS — M79661 Pain in right lower leg: Secondary | ICD-10-CM | POA: Insufficient documentation

## 2023-02-26 DIAGNOSIS — R0602 Shortness of breath: Secondary | ICD-10-CM | POA: Diagnosis not present

## 2023-02-26 LAB — CBC WITH DIFFERENTIAL/PLATELET
Abs Immature Granulocytes: 0.02 10*3/uL (ref 0.00–0.07)
Basophils Absolute: 0 10*3/uL (ref 0.0–0.1)
Basophils Relative: 1 %
Eosinophils Absolute: 0.2 10*3/uL (ref 0.0–0.5)
Eosinophils Relative: 5 %
HCT: 36.1 % — ABNORMAL LOW (ref 39.0–52.0)
Hemoglobin: 11.9 g/dL — ABNORMAL LOW (ref 13.0–17.0)
Immature Granulocytes: 1 %
Lymphocytes Relative: 36 %
Lymphs Abs: 1.5 10*3/uL (ref 0.7–4.0)
MCH: 29.9 pg (ref 26.0–34.0)
MCHC: 33 g/dL (ref 30.0–36.0)
MCV: 90.7 fL (ref 80.0–100.0)
Monocytes Absolute: 0.5 10*3/uL (ref 0.1–1.0)
Monocytes Relative: 13 %
Neutro Abs: 1.9 10*3/uL (ref 1.7–7.7)
Neutrophils Relative %: 44 %
Platelets: 217 10*3/uL (ref 150–400)
RBC: 3.98 MIL/uL — ABNORMAL LOW (ref 4.22–5.81)
RDW: 13.1 % (ref 11.5–15.5)
WBC: 4.2 10*3/uL (ref 4.0–10.5)
nRBC: 0 % (ref 0.0–0.2)

## 2023-02-26 LAB — BASIC METABOLIC PANEL
Anion gap: 5 (ref 5–15)
BUN: 17 mg/dL (ref 6–20)
CO2: 26 mmol/L (ref 22–32)
Calcium: 8.3 mg/dL — ABNORMAL LOW (ref 8.9–10.3)
Chloride: 105 mmol/L (ref 98–111)
Creatinine, Ser: 1.23 mg/dL (ref 0.61–1.24)
GFR, Estimated: >60 mL/min (ref >60)
Glucose, Bld: 90 mg/dL (ref 70–99)
Potassium: 3.8 mmol/L (ref 3.5–5.1)
Sodium: 136 mmol/L (ref 135–145)

## 2023-02-26 LAB — TROPONIN I (HIGH SENSITIVITY)
Troponin I (High Sensitivity): 5 ng/L (ref <18)
Troponin I (High Sensitivity): 5 ng/L (ref <18)

## 2023-02-26 LAB — BRAIN NATRIURETIC PEPTIDE: B Natriuretic Peptide: 26.8 pg/mL (ref 0.0–100.0)

## 2023-02-26 LAB — D-DIMER, QUANTITATIVE: D-Dimer, Quant: 0.57 ug{FEU}/mL — ABNORMAL HIGH (ref 0.00–0.50)

## 2023-02-26 MED ORDER — SODIUM CHLORIDE 0.9 % IV SOLN
1.0000 g | Freq: Once | INTRAVENOUS | Status: AC
  Administered 2023-02-26: 1 g via INTRAVENOUS
  Filled 2023-02-26: qty 10

## 2023-02-26 MED ORDER — IOHEXOL 350 MG/ML SOLN
75.0000 mL | Freq: Once | INTRAVENOUS | Status: AC | PRN
  Administered 2023-02-26: 75 mL via INTRAVENOUS

## 2023-02-26 MED ORDER — CEPHALEXIN 500 MG PO CAPS: 500.0000 mg | ORAL_CAPSULE | Freq: Four times a day (QID) | ORAL | 0 refills | Status: AC

## 2023-02-26 NOTE — ED Notes (Signed)
Patient transported to Ultrasound 

## 2023-02-26 NOTE — ED Triage Notes (Signed)
Pt states right leg swelling x 2 weeks, noticed knot in right calf 1 week ago  States pain worsening as well

## 2023-02-26 NOTE — Discharge Instructions (Signed)
Please follow-up with your primary care provider and orthopedic specialist have attached you for you today.  Today your labs and imaging are reassuring however you need to speak with the orthopedist to see if any CT or MRI of your leg.  You may use compression socks as well.  I prescribed a few days worth of antibiotics to take as well.  If symptoms change or worsen please return to ER.

## 2023-02-26 NOTE — ED Provider Notes (Signed)
[facility] Provider Note   CSN: [id number] Arrival date & time: 02/26/23  1249     History  Chief Complaint  Patient presents with   Leg Swelling    Stephen Cox is a 62 y.o. male history degenerative disc disease, HIV, hypertension, mild OSA presented for right calf tenderness over the past 2 weeks.  Patient denies any fevers, recent travel/hospitalization/surgeries, personal history of cancer, sensation/motor skills.  Patient not having patient's previous.  Patient also does endorse some shortness of breath comes and goes elevated to exertion.  Patient denies hemoptysis, previous blood clots, abdominal pain, nausea/vomiting, recent illness, sick contacts.  Patient states he is taking his Biktarvy.  09/08/2022: Viral load undetectable 09/08/2022: CD4 194  Home Medications Prior to Admission medications   Medication Sig Start Date End Date Taking? Authorizing Provider  cephALEXin (KEFLEX) 500 MG capsule Take 1 capsule (500 mg total) by mouth 4 (four) times daily for 5 days. 02/26/23 03/03/23 Yes Shatonya Passon, Beverly Gust, PA-C  amitriptyline (ELAVIL) 10 MG tablet Take 20 mg by mouth at bedtime as needed. migraine    [provider]  eletriptan (RELPAX) 20 MG tablet One tablet by mouth at onset of headache. May repeat in 2 hours if headache persists or recurs. Nausea. May repeat in 2 hours if necessary    [provider]  Multiple Vitamin (MULTIVITAMIN WITH MINERALS) TABS Take 1 tablet by mouth daily.    [provider]  promethazine (PHENERGAN) 25 MG tablet Take 25 mg by mouth every 6 (six) hours as needed. Nausea    [provider]      Allergies    Patient has no known allergies.    Review of Systems   Review of Systems  Physical Exam Updated Vital Signs BP (!) 152/110 (BP Location: Right Arm)   Pulse 62   Temp 98.4 F (36.9 C) (Oral)   Resp 18   Ht 5\' 8"  (1.727 m)   Wt 93.4 kg   SpO2 100%   BMI 31.31  kg/m  Physical Exam Vitals reviewed.  Constitutional:      General: He is not in acute distress. HENT:     Head: Normocephalic and atraumatic.  Eyes:     Extraocular Movements: Extraocular movements intact.     Conjunctiva/sclera: Conjunctivae normal.     Pupils: Pupils are equal, round, and reactive to light.  Cardiovascular:     Rate and Rhythm: Normal rate and regular rhythm.     Pulses: Normal pulses.     Heart sounds: Normal heart sounds.     Comments: 2+ bilateral radial/dorsalis pedis pulses with regular rate Pulmonary:     Effort: Pulmonary effort is normal. No respiratory distress.     Breath sounds: Normal breath sounds.  Abdominal:     Palpations: Abdomen is soft.     Tenderness: There is no abdominal tenderness. There is no guarding or rebound.  Musculoskeletal:        General: Normal range of motion.     Cervical back: Normal range of motion and neck supple.     Right lower leg: Edema (Right calf tenderness) present.     Comments: 5 out of 5 bilateral grip/leg extension strength  Skin:    General: Skin is warm and dry.     Capillary Refill: Capillary refill takes less than 2 seconds.     Comments: No overlying skin color changes, fluctuance, warmth, discharge  Neurological:     General:  No focal deficit present.     Mental Status: He is alert and oriented to person, place, and time.     Comments: Sensation intact in all 4 limbs  Psychiatric:        Mood and Affect: Mood normal.     ED Results / Procedures / Treatments   Labs (all labs ordered are listed, but only abnormal results are displayed) Labs Reviewed  BASIC METABOLIC PANEL - Abnormal; Notable for the following components:      Result Value   Calcium 8.3 (*)    All other components within normal limits  CBC WITH DIFFERENTIAL/PLATELET - Abnormal; Notable for the following components:   RBC 3.98 (*)    Hemoglobin 11.9 (*)    HCT 36.1 (*)    All other components within normal limits  D-DIMER,  QUANTITATIVE - Abnormal; Notable for the following components:   D-Dimer, Quant 0.57 (*)    All other components within normal limits  BRAIN NATRIURETIC PEPTIDE  TROPONIN I (HIGH SENSITIVITY)  TROPONIN I (HIGH SENSITIVITY)    EKG EKG Interpretation Date/Time:  Friday February 26 2023 14:32:14 EDT Ventricular Rate:  63 PR Interval:  179 QRS Duration:  93 QT Interval:  391 QTC Calculation: 401 R Axis:   67  Text Interpretation: Sinus rhythm Consider left atrial enlargement when compared to prior, similar appearance with slower rate. NO STEMI Confirmed by Theda Belfast ([id number]) on 02/26/2023 2:35:04 PM  Radiology CT Angio Chest PE W/Cm &/Or Wo Cm  Result Date: 02/26/2023 CLINICAL DATA:  Pulmonary embolism (PE) suspected, low to intermediate prob, positive D-dimer Elevated D-dimer, shortness of breath. Right leg swelling. EXAM: CT ANGIOGRAPHY CHEST WITH CONTRAST TECHNIQUE: Multidetector CT imaging of the chest was performed using the standard protocol during bolus administration of intravenous contrast. Multiplanar CT image reconstructions and MIPs were obtained to evaluate the vascular anatomy. RADIATION DOSE REDUCTION: This exam was performed according to the departmental dose-optimization program which includes automated exposure control, adjustment of the mA and/or kV according to patient size and/or use of iterative reconstruction technique. CONTRAST:  75mL OMNIPAQUE IOHEXOL 350 MG/ML SOLN COMPARISON:  None Available. FINDINGS: Cardiovascular: No filling defects in the pulmonary arteries to suggest pulmonary emboli. Heart is normal size. Aorta is normal caliber. Mediastinum/Nodes: No mediastinal, hilar, or axillary adenopathy. Trachea and esophagus are unremarkable. Thyroid unremarkable. Lungs/Pleura: Lungs are clear. No focal airspace opacities or suspicious nodules. No effusions. Upper Abdomen: No acute findings Musculoskeletal: Chest wall soft tissues are unremarkable. No acute bony  abnormality. Review of the MIP images confirms the above findings. IMPRESSION: No evidence of pulmonary embolus. No acute cardiopulmonary disease. Electronically Signed   By: Charlett Nose M.D.   On: 02/26/2023 17:18   DG Chest Port 1 View  Result Date: 02/26/2023 CLINICAL DATA:  Shortness of breath EXAM: PORTABLE CHEST 1 VIEW COMPARISON:  01/17/2021 FINDINGS: The heart size and mediastinal contours are within normal limits. Both lungs are clear. The visualized skeletal structures are unremarkable. IMPRESSION: No active disease. Electronically Signed   By: Charlett Nose M.D.   On: 02/26/2023 17:17   US Venous Img Lower Unilateral Right  Result Date: 02/26/2023 CLINICAL DATA:  Right calf pain, palpable abnormality and erythema. EXAM: RIGHT LOWER EXTREMITY VENOUS DOPPLER ULTRASOUND TECHNIQUE: Gray-scale sonography with graded compression, as well as color Doppler and duplex ultrasound were performed to evaluate the lower extremity deep venous systems from the level of the common femoral vein and including the common femoral, femoral, profunda femoral, popliteal and calf  veins including the posterior tibial, peroneal and gastrocnemius veins when visible. The superficial great saphenous vein was also interrogated. Spectral Doppler was utilized to evaluate flow at rest and with distal augmentation maneuvers in the common femoral, femoral and popliteal veins. COMPARISON:  None Available. FINDINGS: Contralateral Common Femoral Vein: Respiratory phasicity is normal and symmetric with the symptomatic side. No evidence of thrombus. Normal compressibility. Common Femoral Vein: No evidence of thrombus. Normal compressibility, respiratory phasicity and response to augmentation. Saphenofemoral Junction: No evidence of thrombus. Normal compressibility and flow on color Doppler imaging. Profunda Femoral Vein: No evidence of thrombus. Normal compressibility and flow on color Doppler imaging. Femoral Vein: No evidence of  thrombus. Normal compressibility, respiratory phasicity and response to augmentation. Popliteal Vein: No evidence of thrombus. Normal compressibility, respiratory phasicity and response to augmentation. Calf Veins: No evidence of thrombus. Normal compressibility and flow on color Doppler imaging. Superficial Great Saphenous Vein: No evidence of thrombus. Normal compressibility. Venous Reflux:  None. Other Findings: Elongated complex fluid in the right medial calf over appears to follow fascial planes. This is difficult to accurately measure. Length of fluid is greater than 12 cm. Correlation suggested with clinical findings to suggest either hemorrhage or infection. No evidence of superficial thrombophlebitis. Single mildly prominent right inguinal lymph node may be reactive in nature. IMPRESSION: 1. No evidence of right lower extremity deep venous thrombosis. 2. Elongated complex fluid in the right medial calf that appears to follow fascial planes. Correlation suggested with clinical findings to suggest either hemorrhage or infection. 3. Single mildly prominent right inguinal lymph node may be reactive in nature. Electronically Signed   By: Irish Lack M.D.   On: 02/26/2023 14:32    Procedures Procedures    Medications Ordered in ED Medications  cefTRIAXone (ROCEPHIN) 1 g in sodium chloride 0.9 % 100 mL IVPB (has no administration in time range)  iohexol (OMNIPAQUE) 350 MG/ML injection 75 mL (75 mLs Intravenous Contrast Given 02/26/23 1554)    ED Course/ Medical Decision Making/ A&P                                 Medical Decision Making Amount and/or Complexity of Data Reviewed Labs: ordered. Radiology: ordered.  Risk Prescription drug management.   Stephen Cox 86 y.o. presented today for right leg swelling, shortness of breath. Working DDx that I considered at this time includes, but not limited to, sarcoma, DVT, cellulitis, PE, ACS, CHF, venous insufficiency, dependent edema.  R/o  DDx: DVT, PE, ACS, CHF, venous insufficiency, dependent edema: These are considered less likely due to history of present illness, physical exam, labs/imaging findings  Review of prior external notes: 09/08/22 office visit  Unique Tests and My Interpretation:  CBC: Unremarkable BMP: Unremarkable D-dimer: Elevated 0.57 Troponin: 5, 5 BNP: Unremarkable CT chest: Unremarkable CTA chest: Unremarkable Right lower extremity ultrasound: Fluid in the medial aspect of the right calf that could be hemorrhage or infectious EKG: Sinus 63 bpm, no ST depressions or elevations noted  Social Determinants of Health: none  Discussion with Independent Historian: None  Discussion of Management of Tests: None  Risk: Medium: prescription drug management  Risk Stratification Score: none  Staffed with Silverio Lay, MD  Plan: On exam patient was in no acute with stable vitals.  On exam patient does have right calf tenderness with swelling noted but states that this been going down over the past 2 days however does endorse some shortness of  breath but not endorsing any chest pain or other symptoms with this.  Patient states the shortness of breath comes and goes and is nonexertional at times.  Given patient's recent description of leg swelling with calf tenderness and now having shortness of breath will obtain basic labs and a D-dimer to evaluate for possible PE.  Ultrasound was negative for any blood clots and on exam did not have any erythema, fluctuance or other infectious signs at this time however this could be due to patient having HIV and a CD4 count of 197 last time so may not be able to monitor response so suspect he may need antibiotics if everything else is negative.  D-dimer did come back elevated so will obtain CTA.  CTA and chest x-ray were unremarkable.  Initial troponin was 5 however will get a delta and will add on BNP to evaluate for possible CHF.  At this time I have very low suspicion of any  life-threatening diagnoses pending these labs.  Patient with 1 dose of Rocephin here and Keflex will be prescribed to cover for any cellulitis as patient may not be within normal with orthopedics follow-up with repeat extended CT versus MRI to further evaluate patient's leg swelling for possible sarcoma.  Patient's troponin delta troponin and BNP were unremarkable.  Will give Rocephin here and discharged after with plan listed above.  Patient was given return precautions. Patient stable for discharge at this time.  Patient verbalized understanding of plan.  This chart was dictated using voice recognition software.  Despite best efforts to proofread,  errors can occur which can change the documentation meaning.         Final Clinical Impression(s) / ED Diagnoses Final diagnoses:  Right leg swelling    Rx / DC Orders ED Discharge Orders          Ordered    cephALEXin (KEFLEX) 500 MG capsule  4 times daily        02/26/23 1810              Remi Deter 02/26/23 1813    Tegeler, Canary Brim, MD 02/27/23 1515

## 2023-03-08 ENCOUNTER — Telehealth: Payer: Self-pay | Admitting: *Deleted

## 2023-03-08 NOTE — Telephone Encounter (Signed)
Pt called regarding MD referred to does not accept his insurance.  RNCM sent referral to MD of choice.
